# Patient Record
Sex: Female | Born: 1990 | Race: Black or African American | Hispanic: No | Marital: Single | Smoking: Never smoker
Health system: Southern US, Community
[De-identification: ages and names within clinical notes are randomized; demographics above are authoritative.]

## PROBLEM LIST (undated history)

## (undated) ENCOUNTER — Inpatient Hospital Stay (HOSPITAL_COMMUNITY): Payer: Self-pay

## (undated) DIAGNOSIS — R87629 Unspecified abnormal cytological findings in specimens from vagina: Secondary | ICD-10-CM

## (undated) HISTORY — PX: WISDOM TOOTH EXTRACTION: SHX21

---

## 2018-12-03 ENCOUNTER — Ambulatory Visit (INDEPENDENT_AMBULATORY_CARE_PROVIDER_SITE_OTHER): Payer: BC Managed Care – PPO

## 2018-12-03 DIAGNOSIS — O3680X0 Pregnancy with inconclusive fetal viability, not applicable or unspecified: Secondary | ICD-10-CM

## 2018-12-03 NOTE — Progress Notes (Signed)
Received a call from [facility] [place], stating they had completed an [place] however was only able to see the gestational sac. Eileen Stanford also stated that the pt had a large maternal habitus which could have made the [place] more difficult.    Pt here for f/u [facility] referral.  Pt reports LMP 10/05/18  EDD 07/12/19  8w 3d. Pt continues to deny any vaginal bleeding or pain.  Pt reports bloody show when wiping about two weeks ago nothing sense.  OB US scheduled for February 26th @ 1100. Pt advised that we will f/u with her in regards to her [place] results. Pt stated understanding with no further questions.

## 2018-12-09 ENCOUNTER — Ambulatory Visit (HOSPITAL_COMMUNITY)
Admission: RE | Admit: 2018-12-09 | Discharge: 2018-12-09 | Disposition: A | Discharge status: Home / Self Care | Payer: BC Managed Care – PPO | Source: Ambulatory Visit | Attending: Advanced Practice Midwife | Admitting: Advanced Practice Midwife

## 2018-12-09 ENCOUNTER — Ambulatory Visit (INDEPENDENT_AMBULATORY_CARE_PROVIDER_SITE_OTHER): Payer: BC Managed Care – PPO

## 2018-12-09 DIAGNOSIS — O3680X0 Pregnancy with inconclusive fetal viability, not applicable or unspecified: Secondary | ICD-10-CM | POA: Insufficient documentation

## 2018-12-09 NOTE — Progress Notes (Signed)
Pt here today for OB US results.  Pt denies any pain or vaginal bleeding.  Notified Dr. Marice Potter pt's results.  Provider recommendation to have [place] scheduled in 10 days.  OB US scheduled for 12/18/18 @ 1100.  Pt notified of appt and to continue to watch out for vaginal bleeding saturating a pad an hour and/or increase intensity in pain.  Pt stated understanding with no further questions.

## 2018-12-11 NOTE — Progress Notes (Signed)
I have reviewed this chart and agree with the RN/CMA assessment and management.    Kara Mierzejewski C Alene Bergerson, MD, FACOG Attending Physician, [facility]  

## 2018-12-18 ENCOUNTER — Ambulatory Visit (HOSPITAL_COMMUNITY): Payer: BC Managed Care – PPO

## 2018-12-26 ENCOUNTER — Encounter (HOSPITAL_COMMUNITY): Payer: Self-pay | Admitting: *Deleted

## 2018-12-26 ENCOUNTER — Inpatient Hospital Stay (HOSPITAL_COMMUNITY): Payer: BC Managed Care – PPO

## 2018-12-26 ENCOUNTER — Other Ambulatory Visit: Payer: Self-pay

## 2018-12-26 ENCOUNTER — Inpatient Hospital Stay (HOSPITAL_COMMUNITY)
Admission: AD | Admit: 2018-12-26 | Discharge: 2018-12-26 | Disposition: A | Discharge status: Home / Self Care | Payer: BC Managed Care – PPO | Attending: Obstetrics and Gynecology | Admitting: Obstetrics and Gynecology

## 2018-12-26 DIAGNOSIS — O4690 Antepartum hemorrhage, unspecified, unspecified trimester: Secondary | ICD-10-CM

## 2018-12-26 DIAGNOSIS — R109 Unspecified abdominal pain: Secondary | ICD-10-CM | POA: Diagnosis present

## 2018-12-26 DIAGNOSIS — O0289 Other abnormal products of conception: Secondary | ICD-10-CM

## 2018-12-26 DIAGNOSIS — O4691 Antepartum hemorrhage, unspecified, first trimester: Secondary | ICD-10-CM

## 2018-12-26 DIAGNOSIS — M545 Low back pain: Secondary | ICD-10-CM | POA: Insufficient documentation

## 2018-12-26 DIAGNOSIS — O208 Other hemorrhage in early pregnancy: Secondary | ICD-10-CM | POA: Insufficient documentation

## 2018-12-26 DIAGNOSIS — O26891 Other specified pregnancy related conditions, first trimester: Secondary | ICD-10-CM | POA: Insufficient documentation

## 2018-12-26 DIAGNOSIS — R1032 Left lower quadrant pain: Secondary | ICD-10-CM | POA: Insufficient documentation

## 2018-12-26 DIAGNOSIS — Z3A11 11 weeks gestation of pregnancy: Secondary | ICD-10-CM | POA: Diagnosis not present

## 2018-12-26 HISTORY — DX: Unspecified abnormal cytological findings in specimens from vagina: R87.629

## 2018-12-26 LAB — CBC
HEMATOCRIT: 37.3 % (ref 36.0–46.0)
Hemoglobin: 11.8 g/dL — ABNORMAL LOW (ref 12.0–15.0)
MCH: 25.9 pg — ABNORMAL LOW (ref 26.0–34.0)
MCHC: 31.6 g/dL (ref 30.0–36.0)
MCV: 81.8 fL (ref 80.0–100.0)
Platelets: 331 10*3/uL (ref 150–400)
RBC: 4.56 MIL/uL (ref 3.87–5.11)
RDW: 14.6 % (ref 11.5–15.5)
WBC: 6.3 10*3/uL (ref 4.0–10.5)
nRBC: 0 % (ref 0.0–0.2)

## 2018-12-26 LAB — WET PREP, GENITAL
Clue Cells Wet Prep HPF POC: NONE SEEN
SPERM: NONE SEEN
Trich, Wet Prep: NONE SEEN
YEAST WET PREP: NONE SEEN

## 2018-12-26 LAB — URINALYSIS, ROUTINE W REFLEX MICROSCOPIC
Bilirubin Urine: NEGATIVE
Glucose, UA: NEGATIVE mg/dL
Ketones, ur: 5 mg/dL — AB
Leukocytes,Ua: NEGATIVE
Nitrite: NEGATIVE
Protein, ur: NEGATIVE mg/dL
RBC / HPF: >50 RBC/hpf — ABNORMAL HIGH (ref 0–5)
Specific Gravity, Urine: 1.03 (ref 1.005–1.030)
pH: 5 (ref 5.0–8.0)

## 2018-12-26 LAB — POCT PREGNANCY, URINE: PREG TEST UR: POSITIVE — AB

## 2018-12-26 LAB — ABO/RH: ABO/RH(D): B POS

## 2018-12-26 LAB — HCG, QUANTITATIVE, PREGNANCY: hCG, Beta Chain, Quant, S: 5627 m[IU]/mL — ABNORMAL HIGH (ref <5)

## 2018-12-26 NOTE — MAU Provider Note (Addendum)
History     CSN: [id number]  Arrival date and time: 12/26/18 2048   First Provider Initiated Contact with Patient 12/26/18 2116      Chief Complaint  Patient presents with  . Abdominal Pain  . Vaginal Bleeding   G1 .5 wks by LMP presenting with VB. Has been spotting for 2 weeks. Bleeding became a light period yesterday and moderate today. She is passing clots. Having cramping in LLQ and left lower back. Rates 7/10. No urinary sx. No fevers. No GI sx.   OB History    Gravida  1   Para      Term      Preterm      AB      Living        SAB      TAB      Ectopic      Multiple      Live Births              Past Medical History:  Diagnosis Date  . Vaginal Pap smear, abnormal     Past Surgical History:  Procedure Laterality Date  . WISDOM TOOTH EXTRACTION      Family History  Problem Relation Age of Onset  . Hypertension Mother     Social History   Tobacco Use  . Smoking status: Never Smoker  . Smokeless tobacco: Never Used  Substance Use Topics  . Alcohol use: Not Currently    Frequency: Never  . Drug use: Never    Allergies:  Allergies  Allergen Reactions  . Other     nickel     Medications Prior to Admission  Medication Sig Dispense Refill Last Dose  . acetaminophen (TYLENOL) 500 MG tablet Take 1,000 mg by mouth every 6 (six) hours as needed.   12/25/2018 at Unknown time  . Prenatal Vit-Fe Fumarate-FA (PRENATAL MULTIVITAMIN) TABS tablet Take 1 tablet by mouth daily at 12 noon.   12/26/2018 at Unknown time    Review of Systems  Constitutional: Negative for chills and fever.  Gastrointestinal: Positive for abdominal pain. Negative for constipation, diarrhea, nausea and vomiting.  Genitourinary: Positive for vaginal bleeding. Negative for dysuria and urgency.  Musculoskeletal: Positive for back pain.   Physical Exam   Blood pressure 127/85, pulse (!) 101, temperature 98.2 F (36.8 C), temperature source Oral, resp. rate 18, last  menstrual period 10/05/2018.  Physical Exam  Nursing note and vitals reviewed. Constitutional: She is oriented to person, place, and time. She appears well-developed and well-nourished. No distress.  HENT:  Head: Normocephalic and atraumatic.  Neck: Normal range of motion.  Respiratory: Effort normal. No respiratory distress.  Genitourinary:    Genitourinary Comments: External: no lesions or erythema Vagina: rugated, pink, moist, small bloody discharge, cleared with 1 fox swab Uterus: non enlarged, anteverted, non tender, no CMT Adnexae: no masses, no tenderness left, no tenderness right Cervix closed    Musculoskeletal: Normal range of motion.  Neurological: She is alert and oriented to person, place, and time.  Psychiatric: She has a normal mood and affect.   Results for orders placed or performed during the hospital encounter of 12/26/18 (from the past 24 hour(s))  Pregnancy, urine POC     Status: Abnormal   Collection Time: 12/26/18  9:02 PM  Result Value Ref Range   Preg Test, Ur POSITIVE (A) NEGATIVE   No results found.  MAU Course  Procedures Orders Placed This Encounter  Procedures  . Wet prep, genital  Standing Status:   Standing    Number of Occurrences:   1    Order Specific Question:   Patient immune status    Answer:   Normal  . US OB Transvaginal    Standing Status:   Standing    Number of Occurrences:   1    Order Specific Question:   Symptom/Reason for Exam    Answer:   Vaginal bleeding in pregnancy [[id number]]  . Urinalysis, Routine w reflex microscopic    Standing Status:   Standing    Number of Occurrences:   1  . CBC    Standing Status:   Standing    Number of Occurrences:   1  . hCG, quantitative, pregnancy    Standing Status:   Standing    Number of Occurrences:   1  . Pregnancy, urine POC    Standing Status:   Standing    Number of Occurrences:   1  . ABO/Rh    Standing Status:   Standing    Number of Occurrences:   1   MDM Labs and [place]  ordered. Transfer of care given to Rosalita Levan, CNM  12/26/2018 9:34 PM   Labs and [place] report reviewed:  Results for orders placed or performed during the hospital encounter of 12/26/18 (from the past 24 hour(s))  Urinalysis, Routine w reflex microscopic     Status: Abnormal   Collection Time: 12/26/18  8:54 PM  Result Value Ref Range   Color, Urine YELLOW YELLOW   APPearance HAZY (A) CLEAR   Specific Gravity, Urine 1.030 1.005 - 1.030   pH 5.0 5.0 - 8.0   Glucose, UA NEGATIVE NEGATIVE mg/dL   Hgb urine dipstick LARGE (A) NEGATIVE   Bilirubin Urine NEGATIVE NEGATIVE   Ketones, ur 5 (A) NEGATIVE mg/dL   Protein, ur NEGATIVE NEGATIVE mg/dL   Nitrite NEGATIVE NEGATIVE   Leukocytes,Ua NEGATIVE NEGATIVE   RBC / HPF >50 (H) 0 - 5 RBC/hpf   WBC, UA 0-5 0 - 5 WBC/hpf   Bacteria, UA FEW (A) NONE SEEN   Squamous Epithelial / LPF 0-5 0 - 5   Mucus PRESENT   Pregnancy, urine POC     Status: Abnormal   Collection Time: 12/26/18  9:02 PM  Result Value Ref Range   Preg Test, Ur POSITIVE (A) NEGATIVE  Wet prep, genital     Status: Abnormal   Collection Time: 12/26/18  9:11 PM  Result Value Ref Range   Yeast Wet Prep HPF POC NONE SEEN NONE SEEN   Trich, Wet Prep NONE SEEN NONE SEEN   Clue Cells Wet Prep HPF POC NONE SEEN NONE SEEN   WBC, Wet Prep HPF POC MANY (A) NONE SEEN   Sperm NONE SEEN   ABO/Rh     Status: None   Collection Time: 12/26/18  9:20 PM  Result Value Ref Range   ABO/RH(D) B POS    No rh immune globuloin      NOT A RH IMMUNE GLOBULIN CANDIDATE, PT RH POSITIVE Performed at [facility], [address], [place], [place] [address]   CBC     Status: Abnormal   Collection Time: 12/26/18  9:20 PM  Result Value Ref Range   WBC 6.3 4.0 - 10.5 K/uL   RBC 4.56 3.87 - 5.11 MIL/uL   Hemoglobin 11.8 (L) 12.0 - 15.0 g/dL   HCT 07.1 21.9 - 75.8 %   MCV 81.8 80.0 - 100.0 fL  MCH 25.9 (L) 26.0 - 34.0 pg   MCHC 31.6 30.0 - 36.0 g/dL   RDW 43.3 29.5 - 18.8 %    Platelets 331 150 - 400 K/uL   nRBC 0.0 0.0 - 0.2 %  hCG, quantitative, pregnancy     Status: Abnormal   Collection Time: 12/26/18  9:20 PM  Result Value Ref Range   hCG, Beta Chain, Quant, S 5,627 (H) <5 mIU/mL   US Ob Transvaginal  Result Date: 12/26/2018 CLINICAL DATA:  Pain and bleeding. Estimated gestational age by LMP is 11 weeks 5 days. Quantitative beta HCG is in process. EXAM: TRANSVAGINAL OB ULTRASOUND TECHNIQUE: Transvaginal ultrasound was performed for complete evaluation of the gestation as well as the maternal uterus, adnexal regions, and pelvic cul-de-sac. COMPARISON:  12/09/2018 FINDINGS: Intrauterine gestational sac: A single intrauterine gestational sac is present. Yolk sac:  The yolk sac is identified. Embryo:  Fetal pole is not identified. Cardiac Activity: Not identified. MSD: 16 mm   6 w   2 d Subchorionic hemorrhage: A small subchorionic hemorrhage is identified anteriorly. Maternal uterus/adnexae: No myometrial mass lesions identified. The right ovary is visualized and appears normal. Left ovary is not identified. No free fluid in the pelvis. IMPRESSION: A single intrauterine gestational sac is present. Yolk sac is identified but no fetal pole is seen. Mean gestational sac diameter is consistent with 6 weeks 2 days gestational age. This represents no significant growth since previous study. Based upon absence of an embryo greater than 10 days after previous examination showing a yolk sac, this is consistent with definitive criteria for nonviable pregnancy. Electronically Signed   By: Burman Nieves M.D.   On: 12/26/2018 22:03   Discussed results of ultrasound with patient with this being a failed pregnancy. Patient currently scheduled to have NOB appointment on Wednesday March 18th with Dr Richardson Dopp. Educated and discussed options of failed pregnancy with expectant management vs medical (Cytotec) management.   Patient currently alone at bedside, Given patient time to discuss with  family- patient plans on calling to discuss. Patient decided on expectant management. Encouraged patient to call office on Monday to let them know patient was seen in MAU and to follow up on Wednesday for plan of care. Patient verbalizes understanding. Pt stable at time of discharge.  Assessment and Plan    1. Nonviable pregnancy   2. Vaginal bleeding in pregnancy    Discharge home Follow [facility] on Wednesday at [facility]  Return to MAU as needed Discussed reasons to return to MAU   Follow-up Information    [facility]. Go on 12/31/2018.   Why:  Call office on Monday to discuss results of MAU and keep appointment for necessary follow up          Allergies as of 12/26/2018      Reactions   Other    nickel       Medication List    TAKE these medications   acetaminophen 500 MG tablet Commonly known as:  TYLENOL Take 1,000 mg by mouth every 6 (six) hours as needed.   prenatal multivitamin Tabs tablet Take 1 tablet by mouth daily at 12 noon.      Sharyon Cable, CNM 12/26/18, 10:53 PM

## 2018-12-26 NOTE — MAU Note (Signed)
Pt reports to MAU c/o vaginal bleeding and abdominal pain. Pt states last week she was spotting and then yesterday she started bleeding like a period, today she noticed some clots and started having abdominal pain on her left side that feels like it is getting tight. Pt denies recent intercourse. Pt reports some back pain as well.

## 2018-12-29 LAB — GC/CHLAMYDIA PROBE AMP (~~LOC~~) NOT AT ARMC
Chlamydia: NEGATIVE
Neisseria Gonorrhea: NEGATIVE

## 2019-09-29 LAB — OB RESULTS CONSOLE ABO/RH: RH Type: POSITIVE

## 2019-09-29 LAB — OB RESULTS CONSOLE HEPATITIS B SURFACE ANTIGEN: Hepatitis B Surface Ag: NEGATIVE

## 2019-09-29 LAB — OB RESULTS CONSOLE GC/CHLAMYDIA
Chlamydia: NEGATIVE
Gonorrhea: NEGATIVE

## 2019-09-29 LAB — OB RESULTS CONSOLE HIV ANTIBODY (ROUTINE TESTING): HIV: NONREACTIVE

## 2019-09-29 LAB — OB RESULTS CONSOLE RUBELLA ANTIBODY, IGM: Rubella: IMMUNE

## 2019-09-29 LAB — OB RESULTS CONSOLE RPR: RPR: NONREACTIVE

## 2019-09-29 LAB — OB RESULTS CONSOLE ANTIBODY SCREEN: Antibody Screen: NEGATIVE

## 2019-10-16 NOTE — L&D Delivery Note (Signed)
Delivery Note At 3:30 PM a viable female was delivered via Vaginal, Spontaneous (Presentation: Right Occiput Anterior). Nuchal Cord x 1 reduced  APGAR: 9, 9; weight  .   Placenta status: Spontaneous, Intact.  Cord: 3 vessels with the following complications: None.  Cord pH: NA  Anesthesia: None Episiotomy: None Lacerations: 1st degree Suture Repair: 3.0 vicryl Est. Blood Loss (mL): 250  Mom to postpartum.  Baby to Couplet care / Skin to Skin.  Gerald Leitz 04/25/2020, 4:09 PM

## 2019-12-23 ENCOUNTER — Other Ambulatory Visit: Payer: Self-pay

## 2019-12-23 ENCOUNTER — Encounter (HOSPITAL_COMMUNITY): Payer: Self-pay | Admitting: Family Medicine

## 2019-12-23 ENCOUNTER — Emergency Department (HOSPITAL_COMMUNITY)
Admission: AD | Admit: 2019-12-23 | Discharge: 2019-12-24 | Disposition: A | Discharge status: Home / Self Care | Payer: BC Managed Care – PPO | Attending: Emergency Medicine | Admitting: Emergency Medicine

## 2019-12-23 DIAGNOSIS — Z3A23 23 weeks gestation of pregnancy: Secondary | ICD-10-CM | POA: Insufficient documentation

## 2019-12-23 DIAGNOSIS — R0789 Other chest pain: Secondary | ICD-10-CM | POA: Diagnosis not present

## 2019-12-23 DIAGNOSIS — O26822 Pregnancy related peripheral neuritis, second trimester: Secondary | ICD-10-CM | POA: Insufficient documentation

## 2019-12-23 LAB — BASIC METABOLIC PANEL
Anion gap: 8 (ref 5–15)
BUN: <5 mg/dL — ABNORMAL LOW (ref 6–20)
CO2: 21 mmol/L — ABNORMAL LOW (ref 22–32)
Calcium: 9 mg/dL (ref 8.9–10.3)
Chloride: 105 mmol/L (ref 98–111)
Creatinine, Ser: 0.73 mg/dL (ref 0.44–1.00)
GFR calc Af Amer: >60 mL/min (ref >60)
GFR calc non Af Amer: >60 mL/min (ref >60)
Glucose, Bld: 111 mg/dL — ABNORMAL HIGH (ref 70–99)
Potassium: 3.6 mmol/L (ref 3.5–5.1)
Sodium: 134 mmol/L — ABNORMAL LOW (ref 135–145)

## 2019-12-23 LAB — CBC
HCT: 34 % — ABNORMAL LOW (ref 36.0–46.0)
Hemoglobin: 10.8 g/dL — ABNORMAL LOW (ref 12.0–15.0)
MCH: 26.3 pg (ref 26.0–34.0)
MCHC: 31.8 g/dL (ref 30.0–36.0)
MCV: 82.9 fL (ref 80.0–100.0)
Platelets: 337 10*3/uL (ref 150–400)
RBC: 4.1 MIL/uL (ref 3.87–5.11)
RDW: 14 % (ref 11.5–15.5)
WBC: 7.3 10*3/uL (ref 4.0–10.5)
nRBC: 0 % (ref 0.0–0.2)

## 2019-12-23 LAB — PROTIME-INR
INR: 1 (ref 0.8–1.2)
Prothrombin Time: 13.4 seconds (ref 11.4–15.2)

## 2019-12-23 LAB — TROPONIN I (HIGH SENSITIVITY): Troponin I (High Sensitivity): <2 ng/L (ref <18)

## 2019-12-23 MED ORDER — SODIUM CHLORIDE 0.9% FLUSH: 3.0000 mL | Freq: Once | INTRAVENOUS | Status: DC

## 2019-12-23 NOTE — MAU Note (Addendum)
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 Tried drinking water, lying down and resting, but didn't help. States afterwards, she felt as if she was choking. Chest is hurting in the middle, started hurting about 2 hours after palpitations started. Describes pain as tightness. Has had rapid heartbeat before, but usually goes away on it's on. No SOB, history of cardiac problems, blood clots, or asthma.

## 2019-12-23 NOTE — ED Triage Notes (Signed)
Patient reports intermittent central chest pain with palpitations and SOB onset today , mild nausea , denies emesis or diaphoresis . She is [redacted] weeks pregnant G2P0 , denies vaginal bleeding or discharge /no abdominal contractions .

## 2019-12-24 LAB — TROPONIN I (HIGH SENSITIVITY): Troponin I (High Sensitivity): 3 ng/L (ref <18)

## 2019-12-24 NOTE — ED Provider Notes (Signed)
[facility] Provider Note   CSN: [id number] Arrival date & time: 12/23/19  2024     History Chief Complaint  Patient presents with  . Chest Pain    [redacted] weeks pregnant  . Palpitations    [facility] is a 29 y.o. female who is currently [redacted] weeks pregnant who presents for evaluation of chest pain, palpitations.  She reports that this has been intermittently occurring for about a week.  She reports he had 2 episodes today.  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  No associated diaphoresis, nausea/vomiting.  She did have a little bit of shortness of breath.  She states that it was not worse with exertion.  She states that the episode lasted for about 10 minutes and then resolved.  She reports that the episode occurred again about 4 PM.  She describes chest pain as tightness and felt like "like a bouncing ball on her chest."  She states that it was slightly worse with deep inspiration but did not feel like it was worse with exertion.  She had some shortness of breath associated initially and she tried a breathing treatment at home.  She does not any history of asthma or COPD but was told that with pregnancy, she may have some trouble breathing.  Patient states that the shortness of breath has resolved and she currently does not have that anymore.  She states that currently the tightness/pressure sensation in her chest is about a 5.5/10.  She has not tried any other meds.  She denies any trauma, injury, fall.  No fevers, cough.  Denies any abdominal pain, vaginal bleeding, loss of fluids.  She is still felt baby move.  She does not smoke.  Denies any personal cardiac history or family history of MI before the age of 48.  She does not have any history of high blood pressure, diabetes. She denies any OCP use, recent immobilization, prior history of DVT/PE, recent surgery, leg swelling, or long travel.  Denies any illicit drug use.   The history is  provided by the patient.       Past Medical History:  Diagnosis Date  . Vaginal Pap smear, abnormal     There are no problems to display for this patient.   Past Surgical History:  Procedure Laterality Date  . WISDOM TOOTH EXTRACTION       OB History    Gravida  2   Para      Term      Preterm      AB      Living        SAB      TAB      Ectopic      Multiple      Live Births              Family History  Problem Relation Age of Onset  . Hypertension Mother     Social History   Tobacco Use  . Smoking status: Never Smoker  . Smokeless tobacco: Never Used  Substance Use Topics  . Alcohol use: Not Currently  . Drug use: Never    Home Medications Prior to Admission medications   Medication Sig Start Date End Date Taking? Authorizing Provider  acetaminophen (TYLENOL) 500 MG tablet Take 1,000 mg by mouth every 6 (six) hours as needed for moderate pain.    Yes [provider]  Prenatal Vit-Fe Fumarate-FA (PRENATAL MULTIVITAMIN) TABS tablet  Take 1 tablet by mouth daily at 12 noon.   Yes [provider]    Allergies    Other  Review of Systems   Review of Systems  Constitutional: Negative for fever.  Respiratory: Positive for shortness of breath (resolved). Negative for cough.   Cardiovascular: Positive for chest pain and palpitations. Negative for leg swelling.  Gastrointestinal: Negative for abdominal pain, nausea and vomiting.  Genitourinary: Negative for dysuria and hematuria.  Neurological: Negative for headaches.  All other systems reviewed and are negative.   Physical Exam Updated Vital Signs BP 117/71   Pulse 75   Temp 99.2 F (37.3 C)   Resp 18   Ht 5\' 5"  (1.651 m)   Wt 135 kg   SpO2 98%   BMI 49.53 kg/m   Physical Exam Vitals and nursing note reviewed.  Constitutional:      Appearance: Normal appearance. She is well-developed.  HENT:     Head: Normocephalic and atraumatic.  Eyes:     General: Lids  are normal.     Conjunctiva/sclera: Conjunctivae normal.     Pupils: Pupils are equal, round, and reactive to light.  Cardiovascular:     Rate and Rhythm: Normal rate and regular rhythm.     Pulses: Normal pulses.          Radial pulses are 2+ on the right side and 2+ on the left side.       Dorsalis pedis pulses are 2+ on the right side and 2+ on the left side.     Heart sounds: Normal heart sounds. No murmur. No friction rub. No gallop.   Pulmonary:     Effort: Pulmonary effort is normal.     Breath sounds: Normal breath sounds.     Comments: Lungs clear to auscultation bilaterally.  Symmetric chest rise.  No wheezing, rales, rhonchi. Chest:       Comments: Pain reproduced with palpation of the left anterior chest.  No deformity or crepitus noted. Abdominal:     Palpations: Abdomen is soft. Abdomen is not rigid.     Tenderness: There is no abdominal tenderness. There is no guarding.  Musculoskeletal:        General: Normal range of motion.     Cervical back: Full passive range of motion without pain.     Comments: Bilateral lower extremities are symmetric in appearance without any overlying warmth, erythema, edema.  Skin:    General: Skin is warm and dry.     Capillary Refill: Capillary refill takes less than 2 seconds.  Neurological:     Mental Status: She is alert and oriented to person, place, and time.  Psychiatric:        Speech: Speech normal.     ED Results / Procedures / Treatments   Labs (all labs ordered are listed, but only abnormal results are displayed) Labs Reviewed  BASIC METABOLIC PANEL - Abnormal; Notable for the following components:      Result Value   Sodium 134 (*)    CO2 21 (*)    Glucose, Bld 111 (*)    BUN <5 (*)    All other components within normal limits  CBC - Abnormal; Notable for the following components:   Hemoglobin 10.8 (*)    HCT 34.0 (*)    All other components within normal limits  PROTIME-INR  TROPONIN I (HIGH SENSITIVITY)    TROPONIN I (HIGH SENSITIVITY)    EKG EKG Interpretation  Date/Time:  Wednesday December 23 2019 21:08:24 EST Ventricular Rate:  71 PR Interval:  144 QRS Duration: 82 QT Interval:  368 QTC Calculation: 399 R Axis:   143 Text Interpretation: Sinus rhythm with marked sinus arrhythmia Right axis deviation Cannot rule out Anterior infarct , age undetermined Abnormal ECG No previous ECGs available Confirmed by Ripley Fraise [phone]) on 12/24/2019 12:34:56 AM   Radiology No results found.  Procedures Procedures (including critical care time)  Medications Ordered in ED Medications  sodium chloride flush (NS) 0.9 % injection 3 mL (has no administration in time range)    ED Course  I have reviewed the triage vital signs and the nursing notes.  Pertinent labs & imaging results that were available during my care of the patient were reviewed by me and considered in my medical decision making (see chart for details).    MDM Rules/Calculators/A&P                      29 year old female who is currently [redacted] weeks pregnant who presents for evaluation of chest pain.  Had an episode about 12 PM another episode about 345-4 PM.  Also describes some palpitations.  Initially had some shortness of breath but that since has resolved.  Pain is worse with exertion.  No fevers, cough.  No abdominal pain, loss of fluid, vaginal bleeding.  She is still felt baby move. Patient is afebrile, non-toxic appearing, sitting comfortably on examination table. Vital signs reviewed and stable.  On exam, she has some area of tenderness noted to left anterior chest wall that reproduces her pain.  Lungs clear to auscultation.  No evidence of respiratory distress.  Low suspicion for ACS etiology as this has some atypical features.  Question if this is musculoskeletal injury given reproducible pain on palpation.  Doubt infectious etiology.  Patient is currently pregnant but has no other PE risk factors.  She is not tachycardic  or hypoxic here in the ED and does not currently have any shortness of breath.  Do not suspect PE at this time.  Plan to check labs, EKG. Discussed patient with Dr. Christy Gentles who is agreeable to plan.   BMP is 134.  Bicarb is 21.  BUN is less than 5.  INR is 1.0.  CBC without any significant leukocytosis.  Initial troponin negative.  Delta troponin is negative.  Patient able to ambulate in the ED while maintaining O2 sats of 98%.  She has has had hemodynamic stability here in the ED.  She is well-appearing.  At this time, do not suspect ACS etiology or PE as a source of her symptoms.  Suspect this most likely musculoskeletal.  Encouraged at home supportive care measures.  I discussed Dr. Christy Gentles who is agreeable to plan. At this time, patient exhibits no emergent life-threatening condition that require further evaluation in ED or admission. Patient had ample opportunity for questions and discussion. All patient's questions were answered with full understanding. Strict return precautions discussed. Patient expresses understanding and agreement to plan.   Portions of this note were generated with [employer]. Dictation errors may occur despite best attempts at proofreading.  Final Clinical Impression(s) / ED Diagnoses Final diagnoses:  Atypical chest pain    Rx / DC Orders ED Discharge Orders    None       Desma Mcgregor 12/24/19 1914    Ripley Fraise, MD 12/24/19 [phone]

## 2019-12-24 NOTE — ED Notes (Signed)
Patient verbalizes understanding of discharge instructions. Opportunity for questioning and answers were provided. All questions answered completely. Armband removed by staff, pt discharged from ED. Ambulatory from ED with strong, steady gait 

## 2019-12-24 NOTE — ED Notes (Signed)
Pt ambulatory in room. O2 sats remained at 99% entire time

## 2019-12-24 NOTE — Discharge Instructions (Signed)
Your work-up today was reassuring.  You may have some musculoskeletal pain that is the cause of your symptoms.  You can take 1000 mg of Tylenol.  Do not exceed 4000 mg of Tylenol a day.  Return to the Emergency Department immediately if you experiencing worsening chest pain, difficulty breathing, nausea/vomiting, get very sweaty, headache or any other worsening or concerning symptoms.

## 2020-03-22 LAB — OB RESULTS CONSOLE GBS: GBS: POSITIVE

## 2020-04-14 ENCOUNTER — Telehealth (HOSPITAL_COMMUNITY): Payer: Self-pay | Admitting: *Deleted

## 2020-04-14 NOTE — Telephone Encounter (Signed)
Preadmission screen  

## 2020-04-15 ENCOUNTER — Telehealth (HOSPITAL_COMMUNITY): Payer: Self-pay | Admitting: *Deleted

## 2020-04-15 ENCOUNTER — Encounter (HOSPITAL_COMMUNITY): Payer: Self-pay | Admitting: *Deleted

## 2020-04-15 NOTE — Telephone Encounter (Signed)
Correct due date

## 2020-04-15 NOTE — Telephone Encounter (Signed)
Preadmission screen  

## 2020-04-23 ENCOUNTER — Other Ambulatory Visit (HOSPITAL_COMMUNITY)
Admission: RE | Admit: 2020-04-23 | Discharge: 2020-04-23 | Disposition: A | Discharge status: Home / Self Care | Payer: BC Managed Care – PPO | Source: Ambulatory Visit | Attending: Obstetrics and Gynecology | Admitting: Obstetrics and Gynecology

## 2020-04-23 ENCOUNTER — Other Ambulatory Visit: Payer: Self-pay | Admitting: Obstetrics and Gynecology

## 2020-04-23 DIAGNOSIS — Z01812 Encounter for preprocedural laboratory examination: Secondary | ICD-10-CM | POA: Insufficient documentation

## 2020-04-23 DIAGNOSIS — Z20822 Contact with and (suspected) exposure to covid-19: Secondary | ICD-10-CM | POA: Insufficient documentation

## 2020-04-23 LAB — SARS CORONAVIRUS 2 (TAT 6-24 HRS): SARS Coronavirus 2: NEGATIVE

## 2020-04-24 ENCOUNTER — Other Ambulatory Visit: Payer: Self-pay | Admitting: Obstetrics and Gynecology

## 2020-04-24 NOTE — H&P (Signed)
Margaret Forbes is a 29 y.o. female  g2p0010 at 41 wks and 1 day presenting for induction of labor due to post dates. Pregnancy has been uncomplicated... prenatal care provided by Dr. Gerald Leitz with [facility].   U/s for EFW on 03/08/2020 fetus was 5 lbs 2 os at the 53%ile. Vertex/ Posterior placenta.   . OB History    Gravida  2   Para      Term      Preterm      AB  1   Living        SAB  1   TAB      Ectopic      Multiple      Live Births             Past Medical History:  Diagnosis Date  . Vaginal Pap smear, abnormal    Past Surgical History:  Procedure Laterality Date  . WISDOM TOOTH EXTRACTION     Family History: family history includes Hypertension in her mother. Social History:  reports that she has never smoked. She has never used smokeless tobacco. She reports previous alcohol use. She reports that she does not use drugs.     Maternal Diabetes: No Genetic Screening: Declined Maternal Ultrasounds/Referrals: Normal Fetal Ultrasounds or other Referrals:  None Maternal Substance Abuse:  No Significant Maternal Medications:  None Significant Maternal Lab Results:  Group B Strep positive Other Comments:  None  Review of Systems  Constitutional: Negative.   HENT: Negative.   Eyes: Negative.   Respiratory: Negative.   Cardiovascular: Negative.   Gastrointestinal: Negative.   Endocrine: Negative.   Genitourinary: Negative.   Musculoskeletal: Negative.   Skin: Negative.   Allergic/Immunologic: Negative.   Neurological: Negative.   Hematological: Negative.   Psychiatric/Behavioral: Negative.    History   unknown if currently breastfeeding. Maternal Exam:  Introitus: Normal vulva.   Physical Exam Vitals reviewed.  Constitutional:      Appearance: Normal appearance.  HENT:     Head: Normocephalic and atraumatic.     Nose: Nose normal.     Mouth/Throat:     Mouth: Mucous membranes are moist.  Eyes:     Pupils: Pupils are equal, round,  and reactive to light.  Cardiovascular:     Rate and Rhythm: Normal rate and regular rhythm.     Pulses: Normal pulses.     Heart sounds: Normal heart sounds.  Pulmonary:     Effort: Pulmonary effort is normal.     Breath sounds: Normal breath sounds.  Abdominal:     General: Bowel sounds are normal.     Tenderness: There is no abdominal tenderness.  Genitourinary:    General: Normal vulva.  Musculoskeletal:        General: Normal range of motion.     Cervical back: Normal range of motion.  Skin:    General: Skin is warm.  Neurological:     General: No focal deficit present.     Mental Status: She is alert and oriented to person, place, and time.  Psychiatric:        Mood and Affect: Mood normal.        Behavior: Behavior normal.     Prenatal labs: ABO, Rh: B/Positive/-- (12/15 0000) Antibody: Negative (12/15 0000) Rubella: Immune (12/15 0000) RPR: Nonreactive (12/15 0000)  HBsAg: Negative (12/15 0000)  HIV: Non-reactive (12/15 0000)  GBS: Positive/-- (06/08 0000)   Assessment/Plan: 41 wks and 1 day for induction due to  post dates.  Cytotec for cervical ripening.  Anticipate SVD    Gerald Leitz 04/24/2020, 2:10 PM

## 2020-04-24 NOTE — H&P (Deleted)
  The note originally documented on this encounter has been moved the the encounter in which it belongs.  

## 2020-04-25 ENCOUNTER — Inpatient Hospital Stay (HOSPITAL_COMMUNITY)
Admission: AD | Admit: 2020-04-25 | Discharge: 2020-04-27 | DRG: 807 | Disposition: A | Discharge status: Home / Self Care | Payer: BC Managed Care – PPO | Attending: Obstetrics and Gynecology | Admitting: Obstetrics and Gynecology

## 2020-04-25 ENCOUNTER — Encounter (HOSPITAL_COMMUNITY): Payer: Self-pay | Admitting: Obstetrics and Gynecology

## 2020-04-25 ENCOUNTER — Inpatient Hospital Stay (HOSPITAL_COMMUNITY): Payer: BC Managed Care – PPO

## 2020-04-25 DIAGNOSIS — Z20822 Contact with and (suspected) exposure to covid-19: Secondary | ICD-10-CM | POA: Diagnosis present

## 2020-04-25 DIAGNOSIS — O99824 Streptococcus B carrier state complicating childbirth: Secondary | ICD-10-CM | POA: Diagnosis present

## 2020-04-25 DIAGNOSIS — Z3A41 41 weeks gestation of pregnancy: Secondary | ICD-10-CM

## 2020-04-25 DIAGNOSIS — O480 Post-term pregnancy: Secondary | ICD-10-CM | POA: Diagnosis present

## 2020-04-25 LAB — CBC
HCT: 37.1 % (ref 36.0–46.0)
Hemoglobin: 12.2 g/dL (ref 12.0–15.0)
MCH: 26.7 pg (ref 26.0–34.0)
MCHC: 32.9 g/dL (ref 30.0–36.0)
MCV: 81.2 fL (ref 80.0–100.0)
Platelets: 365 10*3/uL (ref 150–400)
RBC: 4.57 MIL/uL (ref 3.87–5.11)
RDW: 15.6 % — ABNORMAL HIGH (ref 11.5–15.5)
WBC: 7.5 10*3/uL (ref 4.0–10.5)
nRBC: 0 % (ref 0.0–0.2)

## 2020-04-25 LAB — COMPREHENSIVE METABOLIC PANEL
ALT: 18 U/L (ref 0–44)
AST: 19 U/L (ref 15–41)
Albumin: 2.7 g/dL — ABNORMAL LOW (ref 3.5–5.0)
Alkaline Phosphatase: 283 U/L — ABNORMAL HIGH (ref 38–126)
Anion gap: 10 (ref 5–15)
BUN: 5 mg/dL — ABNORMAL LOW (ref 6–20)
CO2: 24 mmol/L (ref 22–32)
Calcium: 9.1 mg/dL (ref 8.9–10.3)
Chloride: 101 mmol/L (ref 98–111)
Creatinine, Ser: 0.75 mg/dL (ref 0.44–1.00)
GFR calc Af Amer: >60 mL/min (ref >60)
GFR calc non Af Amer: >60 mL/min (ref >60)
Glucose, Bld: 97 mg/dL (ref 70–99)
Potassium: 4.2 mmol/L (ref 3.5–5.1)
Sodium: 135 mmol/L (ref 135–145)
Total Bilirubin: 0.6 mg/dL (ref 0.3–1.2)
Total Protein: 7.1 g/dL (ref 6.5–8.1)

## 2020-04-25 LAB — RPR: RPR Ser Ql: NONREACTIVE

## 2020-04-25 LAB — TYPE AND SCREEN
ABO/RH(D): B POS
Antibody Screen: NEGATIVE

## 2020-04-25 LAB — PROTEIN / CREATININE RATIO, URINE
Creatinine, Urine: 150.36 mg/dL
Protein Creatinine Ratio: 0.21 mg/mg{Cre} — ABNORMAL HIGH (ref 0.00–0.15)
Total Protein, Urine: 31 mg/dL

## 2020-04-25 MED ORDER — OXYCODONE-ACETAMINOPHEN 5-325 MG PO TABS: 1.0000 | ORAL_TABLET | ORAL | Status: DC | PRN

## 2020-04-25 MED ORDER — COCONUT OIL OIL: 1.0000 "application " | TOPICAL_OIL | Status: DC | PRN

## 2020-04-25 MED ORDER — ONDANSETRON HCL 4 MG/2ML IJ SOLN
4.0000 mg | Freq: Four times a day (QID) | INTRAMUSCULAR | Status: DC | PRN
  Administered 2020-04-25: 4 mg via INTRAVENOUS
  Filled 2020-04-25: qty 2

## 2020-04-25 MED ORDER — PENICILLIN G POT IN DEXTROSE 60000 UNIT/ML IV SOLN
3.0000 10*6.[IU] | INTRAVENOUS | Status: DC
  Administered 2020-04-25: 3 10*6.[IU] via INTRAVENOUS
  Filled 2020-04-25: qty 50

## 2020-04-25 MED ORDER — LACTATED RINGERS IV SOLN: INTRAVENOUS | Status: DC

## 2020-04-25 MED ORDER — MISOPROSTOL 25 MCG QUARTER TABLET
25.0000 ug | ORAL_TABLET | ORAL | Status: DC | PRN
  Administered 2020-04-25: 25 ug via VAGINAL
  Filled 2020-04-25: qty 1

## 2020-04-25 MED ORDER — SODIUM CHLORIDE 0.9 % IV SOLN
5.0000 10*6.[IU] | Freq: Once | INTRAVENOUS | Status: AC
  Administered 2020-04-25: 5 10*6.[IU] via INTRAVENOUS
  Filled 2020-04-25: qty 5

## 2020-04-25 MED ORDER — PRENATAL MULTIVITAMIN CH
1.0000 | ORAL_TABLET | Freq: Every day | ORAL | Status: DC
  Administered 2020-04-26 – 2020-04-27 (×2): 1 via ORAL
  Filled 2020-04-25 (×2): qty 1

## 2020-04-25 MED ORDER — SIMETHICONE 80 MG PO CHEW: 80.0000 mg | CHEWABLE_TABLET | ORAL | Status: DC | PRN

## 2020-04-25 MED ORDER — OXYTOCIN-SODIUM CHLORIDE 30-0.9 UT/500ML-% IV SOLN
1.0000 m[IU]/min | INTRAVENOUS | Status: DC
  Administered 2020-04-25: 2 m[IU]/min via INTRAVENOUS

## 2020-04-25 MED ORDER — OXYCODONE-ACETAMINOPHEN 5-325 MG PO TABS: 2.0000 | ORAL_TABLET | ORAL | Status: DC | PRN

## 2020-04-25 MED ORDER — IBUPROFEN 600 MG PO TABS
600.0000 mg | ORAL_TABLET | Freq: Four times a day (QID) | ORAL | Status: DC
  Administered 2020-04-25 – 2020-04-27 (×8): 600 mg via ORAL
  Filled 2020-04-25 (×8): qty 1

## 2020-04-25 MED ORDER — TERBUTALINE SULFATE 1 MG/ML IJ SOLN: 0.2500 mg | Freq: Once | INTRAMUSCULAR | Status: DC | PRN

## 2020-04-25 MED ORDER — LIDOCAINE HCL (PF) 1 % IJ SOLN
30.0000 mL | INTRAMUSCULAR | Status: AC | PRN
  Administered 2020-04-25: 30 mL via SUBCUTANEOUS
  Filled 2020-04-25: qty 30

## 2020-04-25 MED ORDER — WITCH HAZEL-GLYCERIN EX PADS: 1.0000 "application " | MEDICATED_PAD | CUTANEOUS | Status: DC | PRN

## 2020-04-25 MED ORDER — ACETAMINOPHEN 325 MG PO TABS: 650.0000 mg | ORAL_TABLET | ORAL | Status: DC | PRN

## 2020-04-25 MED ORDER — ONDANSETRON HCL 4 MG PO TABS: 4.0000 mg | ORAL_TABLET | ORAL | Status: DC | PRN

## 2020-04-25 MED ORDER — BENZOCAINE-MENTHOL 20-0.5 % EX AERO
1.0000 "application " | INHALATION_SPRAY | CUTANEOUS | Status: DC | PRN
  Administered 2020-04-25: 1 via TOPICAL
  Filled 2020-04-25: qty 56

## 2020-04-25 MED ORDER — DIPHENHYDRAMINE HCL 25 MG PO CAPS: 25.0000 mg | ORAL_CAPSULE | Freq: Four times a day (QID) | ORAL | Status: DC | PRN

## 2020-04-25 MED ORDER — OXYTOCIN BOLUS FROM INFUSION
333.0000 mL | Freq: Once | INTRAVENOUS | Status: AC
  Administered 2020-04-25: 333 mL via INTRAVENOUS

## 2020-04-25 MED ORDER — FENTANYL CITRATE (PF) 100 MCG/2ML IJ SOLN
50.0000 ug | INTRAMUSCULAR | Status: DC | PRN
  Administered 2020-04-25: 100 ug via INTRAVENOUS
  Filled 2020-04-25: qty 2

## 2020-04-25 MED ORDER — ZOLPIDEM TARTRATE 5 MG PO TABS: 5.0000 mg | ORAL_TABLET | Freq: Every evening | ORAL | Status: DC | PRN

## 2020-04-25 MED ORDER — HYDROXYZINE HCL 50 MG PO TABS: 50.0000 mg | ORAL_TABLET | Freq: Four times a day (QID) | ORAL | Status: DC | PRN

## 2020-04-25 MED ORDER — SENNOSIDES-DOCUSATE SODIUM 8.6-50 MG PO TABS
2.0000 | ORAL_TABLET | ORAL | Status: DC
  Administered 2020-04-25 – 2020-04-26 (×2): 2 via ORAL
  Filled 2020-04-25 (×2): qty 2

## 2020-04-25 MED ORDER — LACTATED RINGERS IV SOLN: 500.0000 mL | INTRAVENOUS | Status: DC | PRN

## 2020-04-25 MED ORDER — OXYTOCIN-SODIUM CHLORIDE 30-0.9 UT/500ML-% IV SOLN
2.5000 [IU]/h | INTRAVENOUS | Status: DC
  Filled 2020-04-25: qty 500

## 2020-04-25 MED ORDER — DIBUCAINE (PERIANAL) 1 % EX OINT: 1.0000 "application " | TOPICAL_OINTMENT | CUTANEOUS | Status: DC | PRN

## 2020-04-25 MED ORDER — SOD CITRATE-CITRIC ACID 500-334 MG/5ML PO SOLN: 30.0000 mL | ORAL | Status: DC | PRN

## 2020-04-25 MED ORDER — ONDANSETRON HCL 4 MG/2ML IJ SOLN: 4.0000 mg | INTRAMUSCULAR | Status: DC | PRN

## 2020-04-25 NOTE — Progress Notes (Signed)
   Subjective: Called to see patient due to fetal deceleration to the 80's upon my arrival fetal heart rate 120 with good variability. Per nurse decel resolved with cessation of pitocin/ position change and fluid bolus.   Objective: BP (!) 143/69   Pulse 78   Temp 98 F (36.7 C) (Oral)   Resp 19   Ht 5\' 5"  (1.651 m)   Wt 129.9 kg   BMI 47.64 kg/m  I/O last 3 completed shifts: In: 118 [P.O.:118] Out: -  No intake/output data recorded.  FHT:  FHR: 125 bpm, variability: moderate,  accelerations:  Present,  decelerations:  Absent UC:   regular, every 2 minutes SVE:   Dilation: Lip/rim Effacement (%): 90 Station: 0 Exam by:: [email or web address] MD SROM clear fluid IUPC and FSE placed.   Labs: Lab Results  Component Value Date   WBC 7.5 04/25/2020   HGB 12.2 04/25/2020   HCT 37.1 04/25/2020   MCV 81.2 04/25/2020   PLT 365 04/25/2020    Assessment / Plan:  41 wks and 1 day for induction of labor.  Fetal well being currently category 1 tracing temporary category 2 tracing resolved. D/w pt cesarean section in the event of nonreasuring fetal tracing with risk to include but not limited to infection / bleeding damage to bowel bladder baby with the need for further surgery. Pt voiced understanding and agrees to C/S in the event fetal distress is noted prior to delivery  For now will monitor closely. Active stage is progressing quickly anticipate SVD  06/26/2020 04/25/2020, 2:42 PM

## 2020-04-26 LAB — CBC
HCT: 31.3 % — ABNORMAL LOW (ref 36.0–46.0)
Hemoglobin: 9.9 g/dL — ABNORMAL LOW (ref 12.0–15.0)
MCH: 25.8 pg — ABNORMAL LOW (ref 26.0–34.0)
MCHC: 31.6 g/dL (ref 30.0–36.0)
MCV: 81.5 fL (ref 80.0–100.0)
Platelets: 310 10*3/uL (ref 150–400)
RBC: 3.84 MIL/uL — ABNORMAL LOW (ref 3.87–5.11)
RDW: 15.7 % — ABNORMAL HIGH (ref 11.5–15.5)
WBC: 10.3 10*3/uL (ref 4.0–10.5)
nRBC: 0 % (ref 0.0–0.2)

## 2020-04-26 NOTE — Lactation Note (Signed)
This note was copied from a baby's chart. Lactation Consultation Note  Patient Name: Margaret Forbes Today's Date: 04/26/2020  Mom was shown how to use DEBP & how to disassemble, clean, & reassemble parts. I encouraged Mom to pump when infant receives formula. Size 21 flanges are appropriate for her at this time. I explained they could give any colostrum on the flange on a clean finger to infant.  Pacifier was provided by SLP so that infant can practice sucking before bottle feeding. SLP reports dysfunctional suck.   Lurline Hare [facility] 04/26/2020, 1:53 PM

## 2020-04-26 NOTE — Lactation Note (Addendum)
This note was copied from a baby's chart. Lactation Consultation Note  Patient Name: Margaret Forbes Today's Date: 04/26/2020   Initial visit at 20 hours of life. Mom is a P1 who reports + breast changes w/pregnancy.   Mom has attempted to latch infant, but infant has not had a successful feeding at breast since birth. Mom has been supplementing with bottles, using the extra-slow flow nipple.   Mom was placed in side-lying and latching was attempted. Infant would not open wide & could not obtain a latch. Infant was cueing to feed, Margaret Humble, NP asked me to see how infant did with the bottle, if possible.  The bottle with the extra-slow flow nipple was offered. I noted that infant would bite nipple & would not really suckle. I also felt there were subtle cues that infant felt the flow may be too fast. Mom reports that it takes about 30 minutes for the infant to drink 15 mL.  I notified NP and requested SLP consult. I notified Margaret Forbes, SLP who said she would come soon. Mom comfortable with waiting for SLP. Cameiko, RN was given an update.  I spoke with Mom about pumping whenever infant receives formula. She is amenable to doing so. It appears that Mom may benefit from size 21 flanges, which I took into room. RN will show Mom how to use pump when able.   Margaret Forbes [facility] 04/26/2020, 11:49 AM

## 2020-04-26 NOTE — Progress Notes (Signed)
Post Partum Day 1 Subjective: no complaints, up ad lib, voiding, tolerating PO and + flatus  Objective: Blood pressure 105/67, pulse 69, temperature 98.4 F (36.9 C), temperature source Oral, resp. rate 18, height 5\' 5"  (1.651 m), weight 129.9 kg, SpO2 99 %, unknown if currently breastfeeding.  Physical Exam:  General: alert, cooperative and no distress Lochia: appropriate Uterine Fundus: firm Incision: NA DVT Evaluation: No evidence of DVT seen on physical exam.  Recent Labs    04/25/20 0027 04/26/20 0434  HGB 12.2 9.9*  HCT 37.1 31.3*    Assessment/Plan: Plan for discharge tomorrow, Breastfeeding and Lactation consult  Routine postpartum care    LOS: 1 day   04/28/20 04/26/2020, 5:59 PM

## 2020-04-27 MED ORDER — IBUPROFEN 600 MG PO TABS: 600.0000 mg | ORAL_TABLET | Freq: Four times a day (QID) | ORAL | 0 refills | Status: AC | PRN

## 2020-04-27 NOTE — Discharge Summary (Signed)
Postpartum Discharge Summary  Date of Service updated 04/27/2020     Patient Name: Margaret Forbes DOB: 08/04/1991 MRN: [id number]  Date of admission: 04/25/2020 Delivery date:04/25/2020  Delivering provider: Christophe Louis  Date of discharge: 04/27/2020  Admitting diagnosis: Post term pregnancy [O48.0] Intrauterine pregnancy: [redacted]w[redacted]d    Secondary diagnosis:  Active Problems:   Post term pregnancy  Additional problems: None    Discharge diagnosis: Term Pregnancy Delivered                                              Post partum procedures:None Augmentation: AROM, Pitocin and Cytotec Complications: None  Hospital course: Induction of Labor With Vaginal Delivery   29y.o. yo G2P1011 at 49w1das admitted to the hospital 04/25/2020 for induction of labor.  Indication for induction: Postdates.  Patient had an uncomplicated labor course as follows: Membrane Rupture Time/Date: 2:21 PM ,04/25/2020   Delivery Method:Vaginal, Spontaneous  Episiotomy: None  Lacerations:  1st degree  Details of delivery can be found in separate delivery note.  Patient had a routine postpartum course. Patient is discharged home 04/27/20.  Newborn Data: Birth date:04/25/2020  Birth time:3:30 PM  Gender:Female  Living status:Living  Apgars:9 ,9  Weight:2931 g   Magnesium Sulfate received: No BMZ received: No Rhophylac:No MMR:No T-DaP:Given prenatally Flu: No Transfusion:No  Physical exam  Vitals:   04/26/20 0850 04/26/20 1418 04/26/20 2009 04/27/20 0533  BP: (!) 112/57 105/67 125/80 123/77  Pulse: 64 69 77 75  Resp: _0 Temp: 97.7 F (36.5 C) 98.4 F (36.9 C) 98 F (36.7 C) 97.9 F (36.6 C)  TempSrc: Oral Oral Oral Oral  SpO2: 99%  100% 100%  Weight:      Height:       General: alert, cooperative and no distress Lochia: appropriate Uterine Fundus: firm Incision: N/A DVT Evaluation: No evidence of DVT seen on physical exam. Labs: Lab Results  Component Value Date   WBC 10.3  04/26/2020   HGB 9.9 (L) 04/26/2020   HCT 31.3 (L) 04/26/2020   MCV 81.5 04/26/2020   PLT 310 04/26/2020   CMP Latest Ref Rng & Units 04/25/2020  Glucose 70 - 99 mg/dL 97  BUN 6 - 20 mg/dL 5(L)  Creatinine 0.44 - 1.00 mg/dL 0.75  Sodium 135 - 145 mmol/L 135  Potassium 3.5 - 5.1 mmol/L 4.2  Chloride 98 - 111 mmol/L 101  CO2 22 - 32 mmol/L 24  Calcium 8.9 - 10.3 mg/dL 9.1  Total Protein 6.5 - 8.1 g/dL 7.1  Total Bilirubin 0.3 - 1.2 mg/dL 0.6  Alkaline Phos 38 - 126 U/L 283(H)  AST 15 - 41 U/L 19  ALT 0 - 44 U/L 18   Edinburgh Score: Edinburgh Postnatal Depression Scale Screening Tool 04/27/2020  I have been able to laugh and see the funny side of things. 0  I have looked forward with enjoyment to things. 0  I have blamed myself unnecessarily when things went wrong. 0  I have been anxious or worried for no good reason. 2  I have felt scared or panicky for no good reason. 0  Things have been getting on top of me. 0  I have been so unhappy that I have had difficulty sleeping. 0  I have felt sad or miserable. 0  I have been so unhappy that I  have been crying. 0  The thought of harming myself has occurred to me. 0  Edinburgh Postnatal Depression Scale Total 2      After visit meds:  Allergies as of 04/27/2020      Reactions   Other Rash   nickel       Medication List    TAKE these medications   acetaminophen 500 MG tablet Commonly known as: TYLENOL Take 1,000 mg by mouth every 6 (six) hours as needed for moderate pain.   ibuprofen 600 MG tablet Commonly known as: ADVIL Take 1 tablet (600 mg total) by mouth every 6 (six) hours as needed.   prenatal multivitamin Tabs tablet Take 1 tablet by mouth daily at 12 noon.        Discharge home in stable condition Infant Feeding: Breast Infant Disposition:home with mother Discharge instruction: per After Visit Summary and Postpartum booklet. Activity: Advance as tolerated. Pelvic rest for 6 weeks.  Diet: routine  diet Anticipated Birth Control: Unsure Postpartum Appointment:6 weeks Additional Postpartum F/U: None Future Appointments:No future appointments. Follow up Visit:  Follow-up Information    Christophe Louis, MD. Go in 6 week(s).   Specialty: Obstetrics and Gynecology Why: pt should already have an appointment for postpartum visit in 6 weeks  Contact information: [address] [place] [address] [phone]                   04/27/2020 Christophe Louis, MD

## 2020-04-27 NOTE — Lactation Note (Signed)
This note was copied from a baby's chart. Lactation Consultation Note  Patient Name: Margaret Forbes Date: 04/27/2020 Reason for consult: Follow-up assessment;Primapara;1st time breastfeeding;Term;Infant weight loss;Other (Comment) (pumping and bottle feeding)  Baby is 41 hours old  Per mom the last time pumped was at 1 am and expressed feeling encouraged due to drops from both breast.  Reviewed supply and demand / stressed the importance of consistent pumping around the clock 8-10 times both breast. Per mom has a DEBP at home - ( Medela )  LC reminded mom the more consistent she is with pumping it will enhance her milk coming in and the 1st 2 weeks being the most important to establish milk supply. S/S of mastitis reviewed.  Per mom the gold nipple is working well and baby is handling the flow without leakage.  Baby last fed at 7:30 am - 15 ml and average range has been 15 -20 ml . Sound asleep.  LC reminded mom to work on increasing volume to 30 ml and by the end of the week per feeding 45 ml or greater.  LC provided a LC pamphlet with phone numbers.  Mom and dad receptive to teaching and review.       Maternal Data    Feeding Feeding Type:  (RN updated the doc flow sheets/ using the Nufant gold nipple and working well) Nipple Type: Nfant Extra Slow Flow (gold)  LATCH Score                   Interventions Interventions: Breast feeding basics reviewed;DEBP  Lactation Tools Discussed/Used Tools: Pump;Flanges Flange Size: 24;27 Breast pump type: Double-Electric Breast Pump Pump Review: Milk Storage   Consult Status Consult Status: Complete Date: 04/27/20    Kathrin Greathouse 04/27/2020, 8:42 AM

## 2020-12-09 IMAGING — US US OB COMP LESS 14 WK
1 series · 15 of 28 positions shown · non-contrast
Comparison: None.

CLINICAL DATA: First trimester pregnancy with inconclusive fetal
viability.

EXAM:
OBSTETRIC <14 WK US AND TRANSVAGINAL OB US
TECHNIQUE: Both transabdominal and transvaginal ultrasound examinations were
performed for complete evaluation of the gestation as well as the
maternal uterus, adnexal regions, and pelvic cul-de-sac.
Transvaginal technique was performed to assess early pregnancy.

[Series 1: us ob comp less 14 wk · 51 acquisitions, 15 frames shown]
[im 1/51]
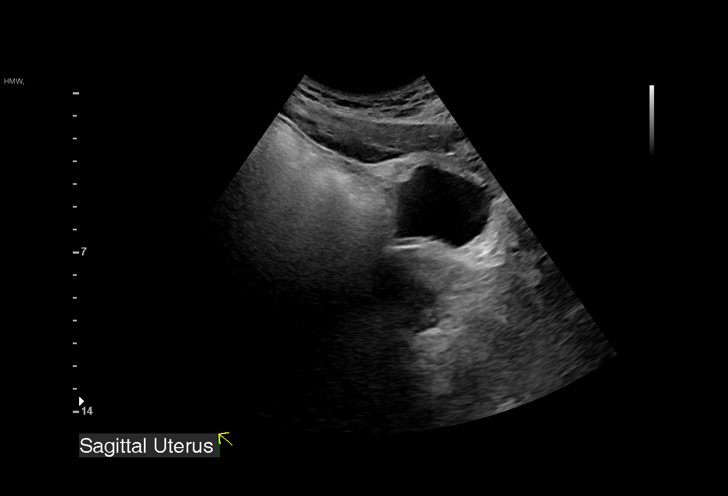
[im 4/51]
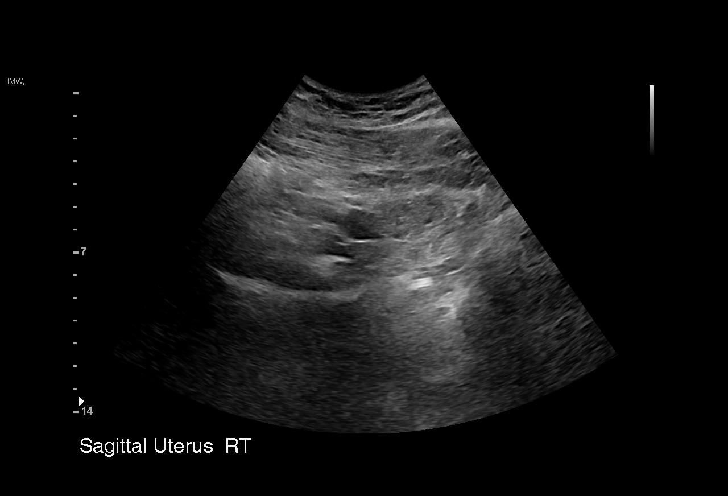
[im 8/51]
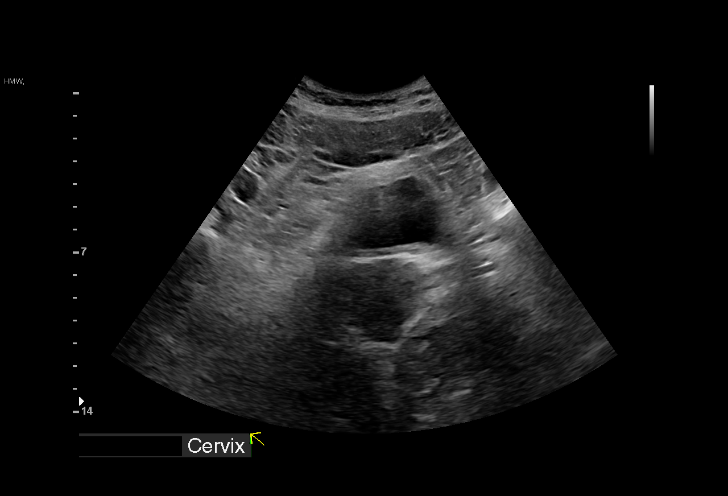
[im 12/51]
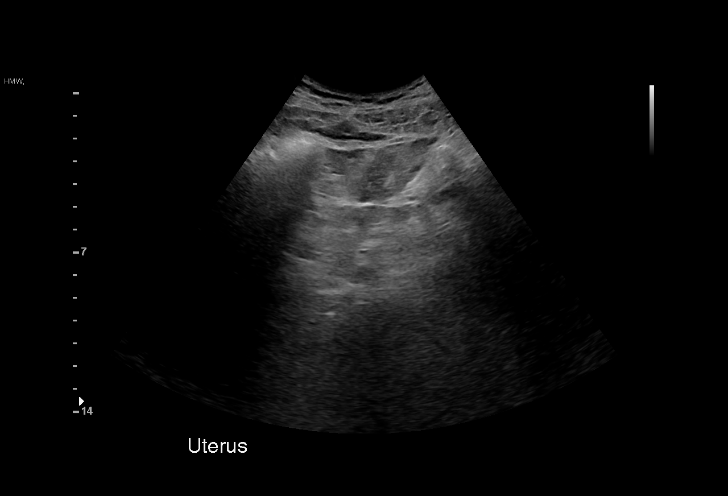
[im 15/51]
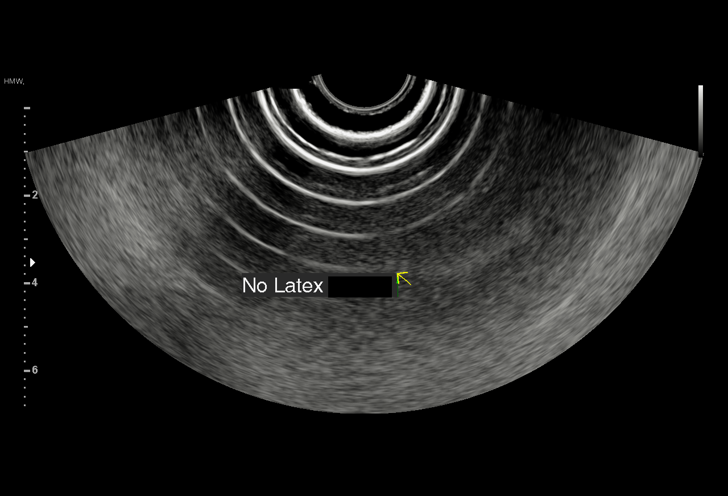
[im 19/51]
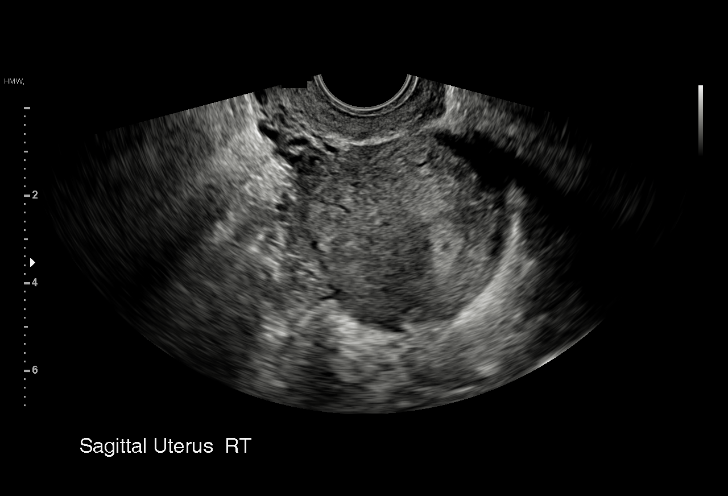
[im 23/51]
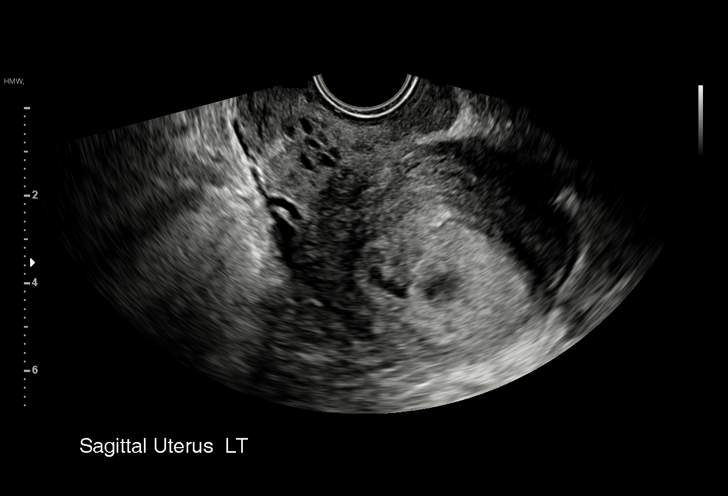
[im 26/51]
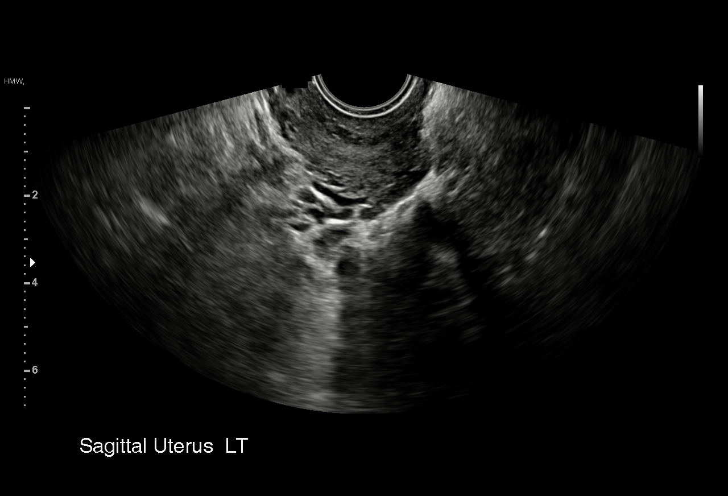
[im 28/51]
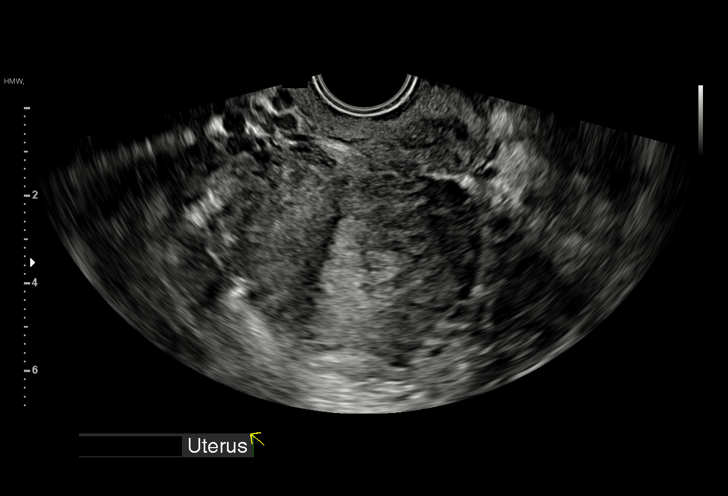
[im 32/51]
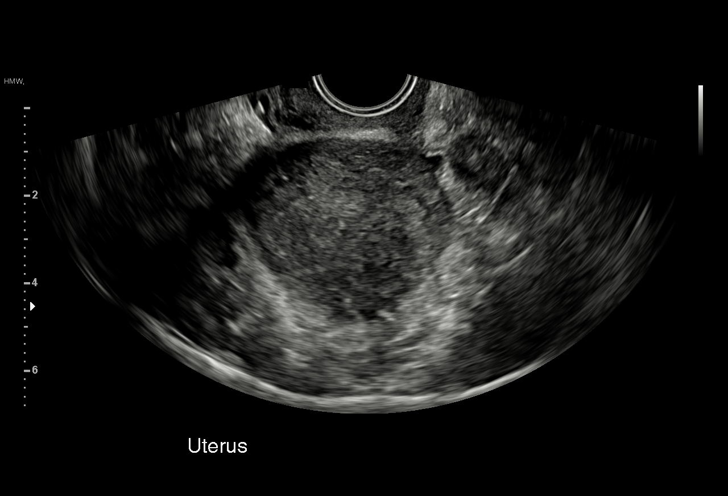
[im 36/51]
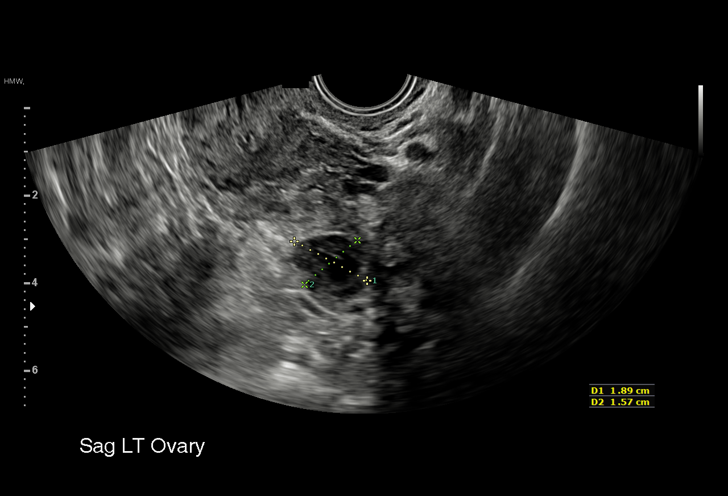
[im 39/51]
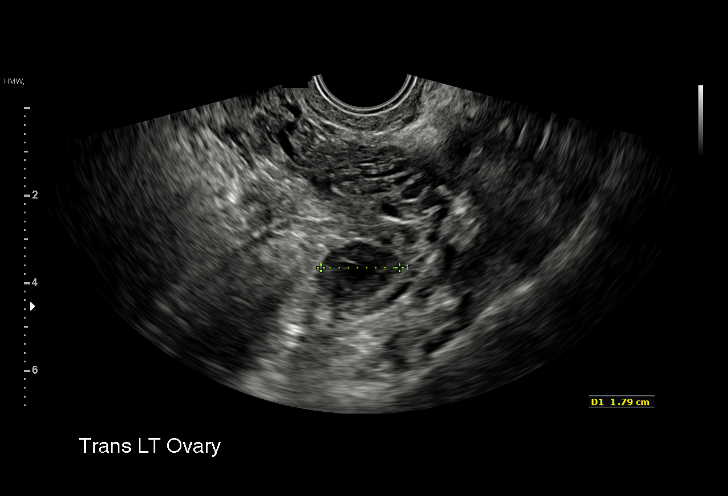
[im 43/51]
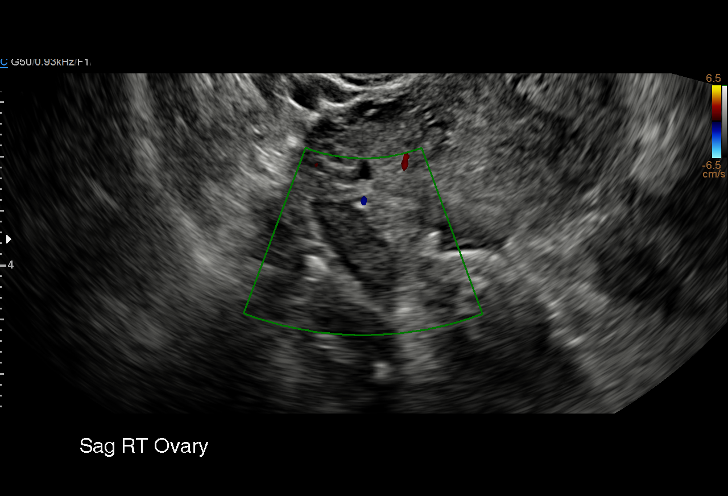
[im 47/51]
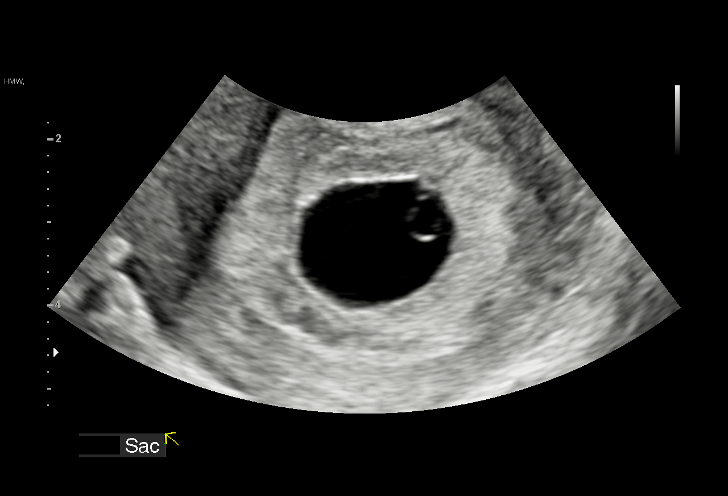
[im 51/51]
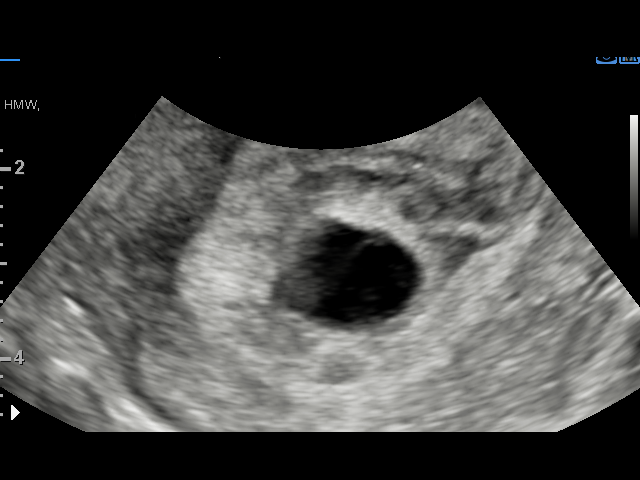

[15 of 28 positions shown; findings below may reference images not displayed]

FINDINGS: Intrauterine gestational sac: Single

Yolk sac:  Visualized.

Embryo:  Not Visualized.

MSD: 18 mm   6 w   5 d

Subchorionic hemorrhage:  None visualized.

Maternal uterus/adnexae: Retroflexed uterus. Normal appearance of
both ovaries. No mass or abnormal free fluid identified.
IMPRESSION: Findings are suspicious but not yet definitive for failed pregnancy.
Recommend follow-up US in 10 days for definitive diagnosis. This
recommendation follows SRU consensus guidelines: Diagnostic Criteria
for Nonviable Pregnancy Early in the First Trimester. N Engl J Med

## 2020-12-26 IMAGING — US TRANSVAGINAL OB ULTRASOUND
1 series · 15 of 28 positions shown · non-contrast
Comparison: 12/09/2018

CLINICAL DATA: Pain and bleeding. Estimated gestational age by LMP
is 11 weeks 5 days. Quantitative beta HCG is in process.

EXAM:
TRANSVAGINAL OB ULTRASOUND
TECHNIQUE: Transvaginal ultrasound was performed for complete evaluation of the
gestation as well as the maternal uterus, adnexal regions, and
pelvic cul-de-sac.

[Series 1: transvaginal ob ultrasound · 36 acquisitions, 15 frames shown]
[im 1/36]
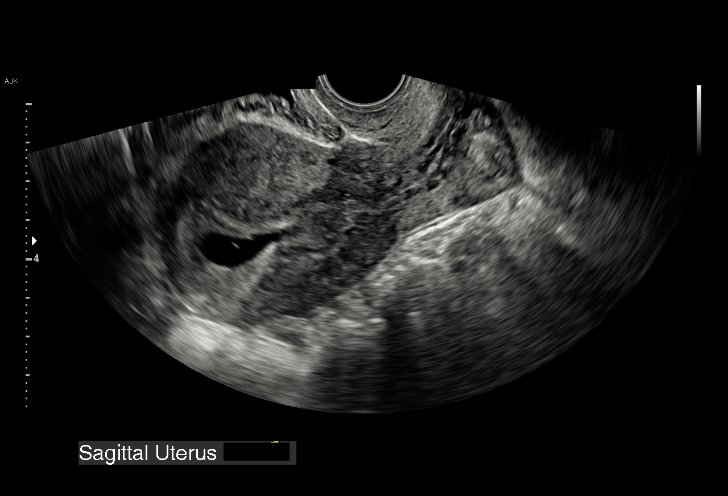
[im 3/36]
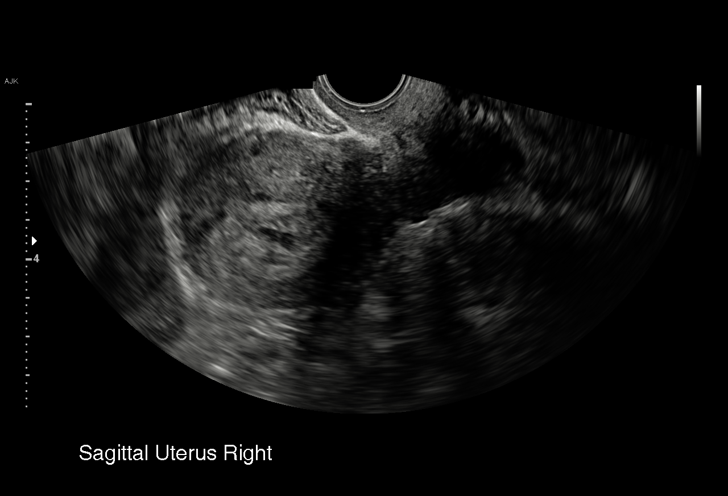
[im 6/36]
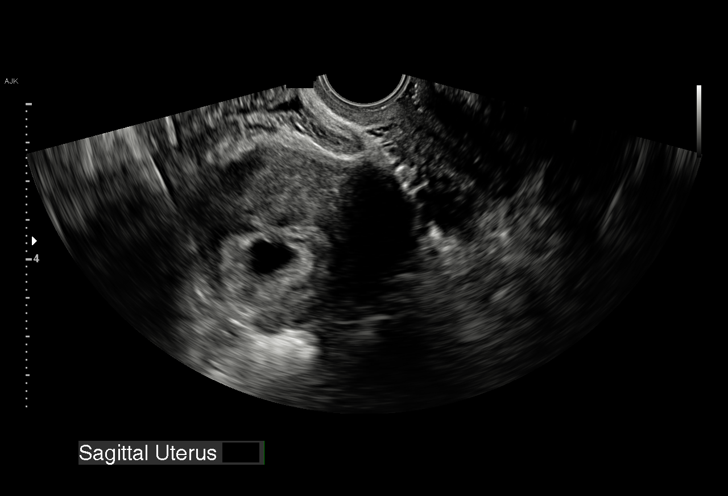
[im 8/36]
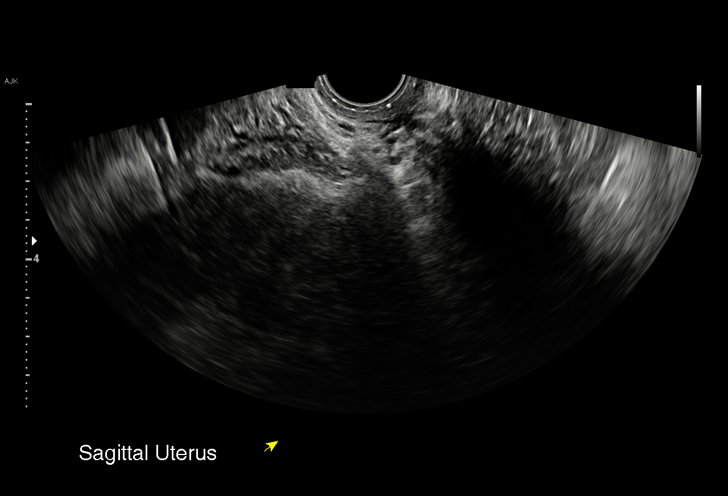
[im 11/36]
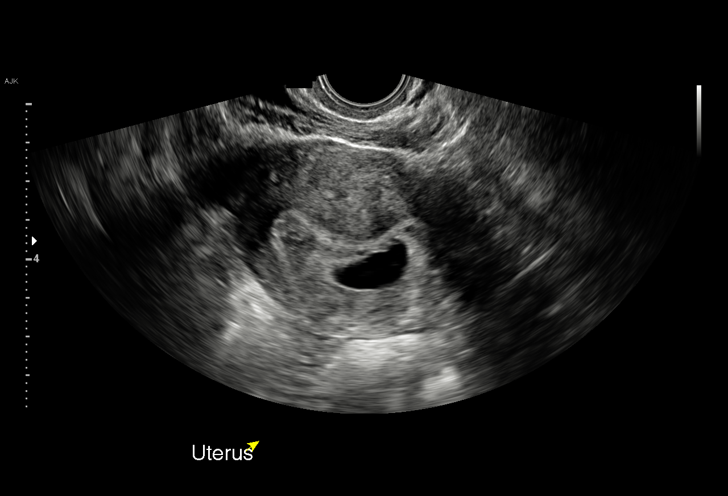
[im 13/36]
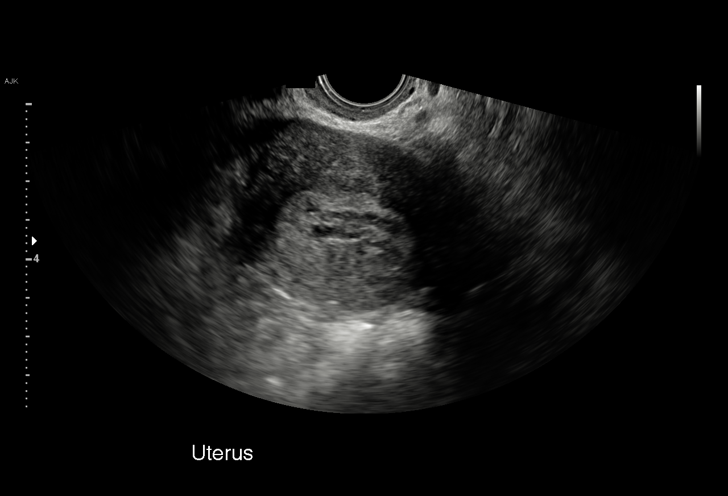
[im 16/36]
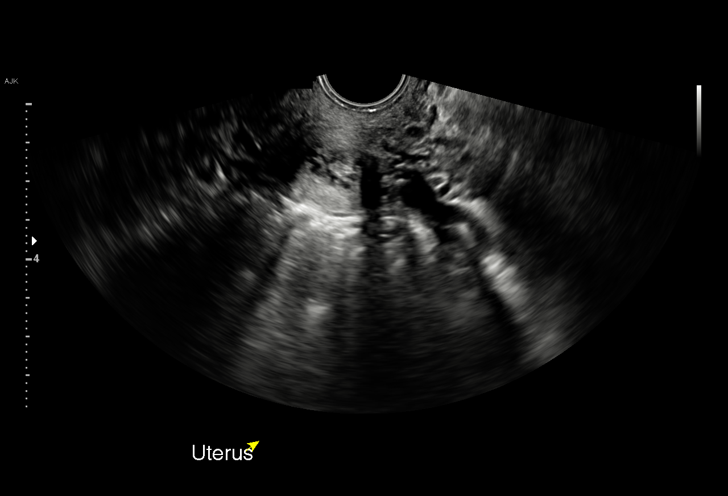
[im 19/36]
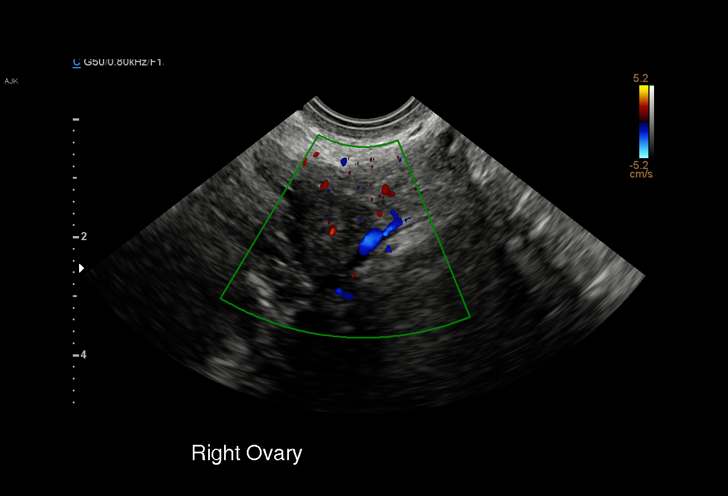
[im 20/36]
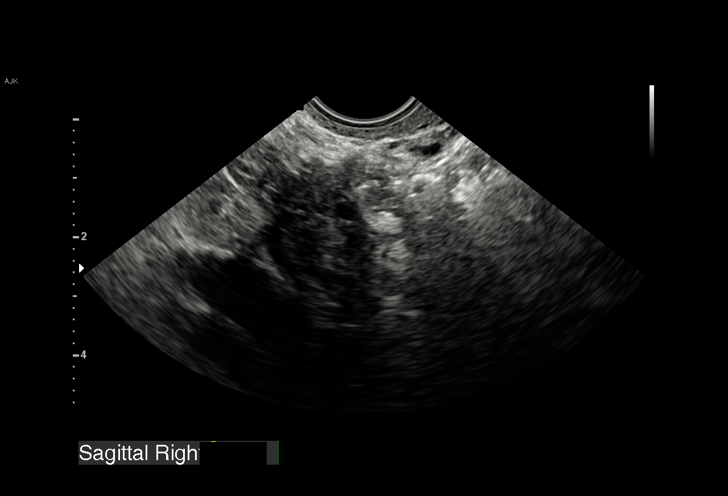
[im 23/36]
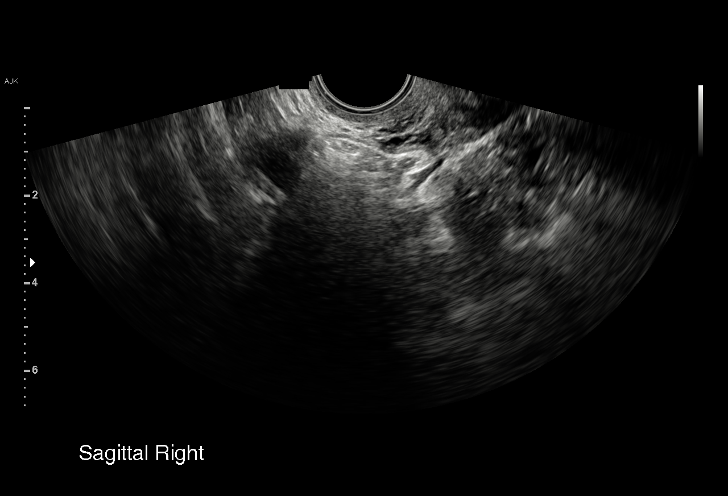
[im 25/36]
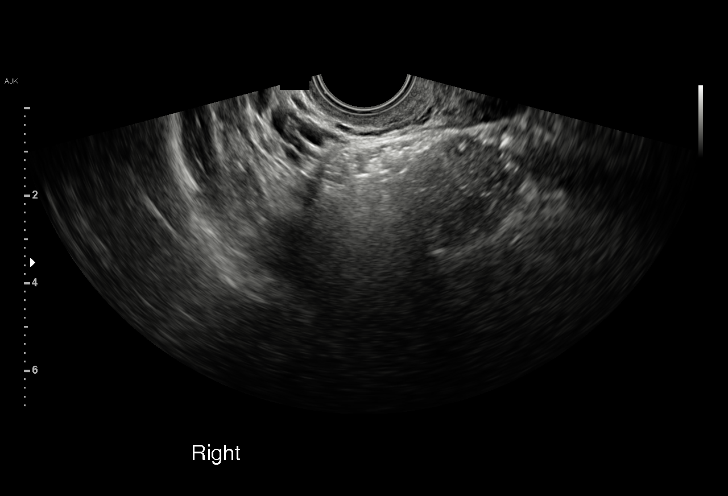
[im 28/36]
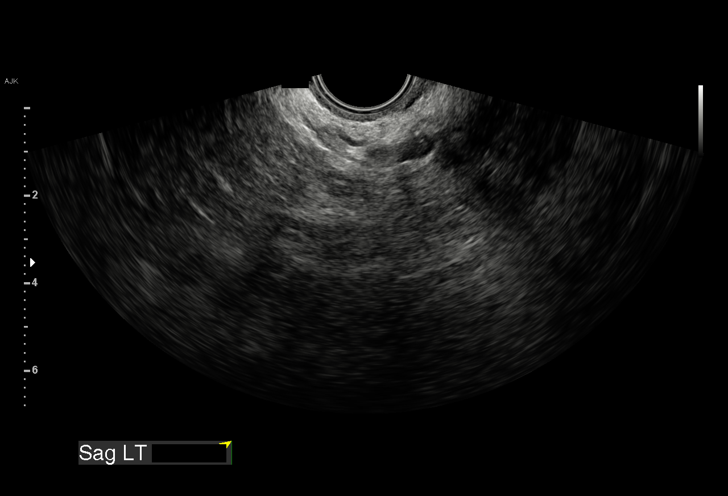
[im 30/36]
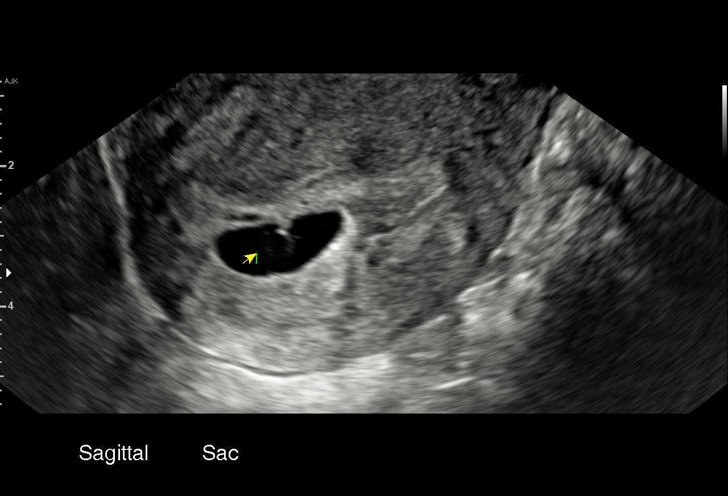
[im 33/36]
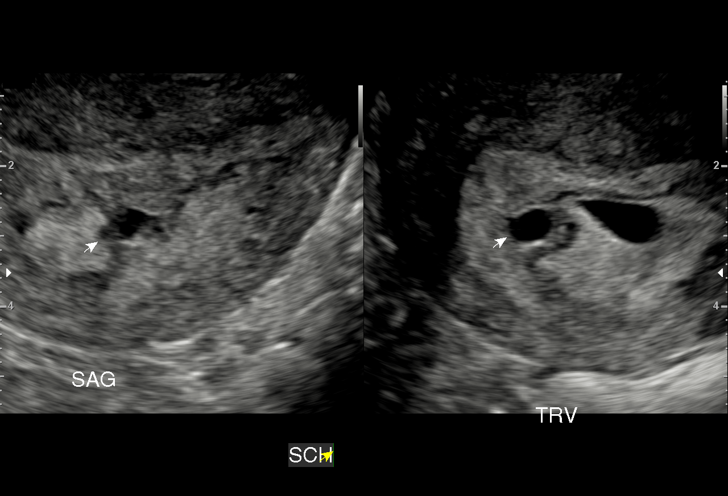
[im 36/36]
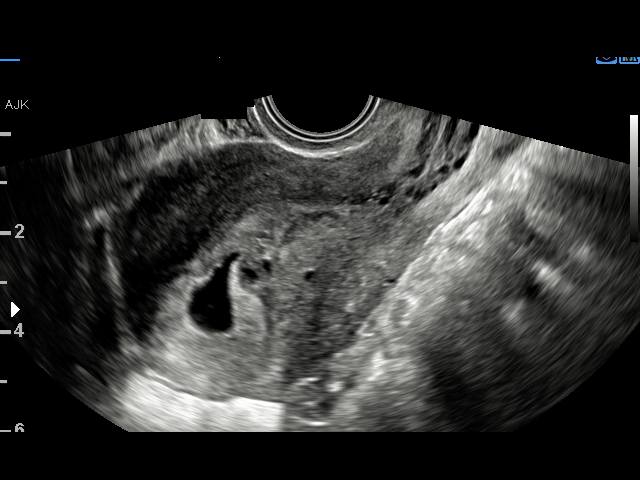

[15 of 28 positions shown; findings below may reference images not displayed]

FINDINGS: Intrauterine gestational sac: A single intrauterine gestational sac
is present.

Yolk sac:  The yolk sac is identified.

Embryo:  Fetal pole is not identified.

Cardiac Activity: Not identified.

MSD: 16 mm   6 w   2 d

Subchorionic hemorrhage: A small subchorionic hemorrhage is
identified anteriorly.

Maternal uterus/adnexae: No myometrial mass lesions identified. The
right ovary is visualized and appears normal. Left ovary is not
identified. No free fluid in the pelvis.
IMPRESSION: A single intrauterine gestational sac is present. Yolk sac is
identified but no fetal pole is seen. Mean gestational sac diameter
is consistent with 6 weeks 2 days gestational age. This represents
no significant growth since previous study. Based upon absence of an
embryo greater than 10 days after previous examination showing a
yolk sac, this is consistent with definitive criteria for nonviable
pregnancy.
